# Patient Record
Sex: Male | Born: 1970 | Race: White | Hispanic: No | Marital: Single
Health system: Southern US, Community
[De-identification: ages and names within clinical notes are randomized; demographics above are authoritative.]

---

## 2006-06-24 ENCOUNTER — Emergency Department (HOSPITAL_COMMUNITY): Admission: EM | Admit: 2006-06-24 | Discharge: 2006-06-24 | Payer: Self-pay | Admitting: Emergency Medicine

## 2006-06-26 ENCOUNTER — Emergency Department (HOSPITAL_COMMUNITY): Admission: EM | Admit: 2006-06-26 | Discharge: 2006-06-26 | Payer: Self-pay | Admitting: *Deleted

## 2007-08-03 IMAGING — CT CT NECK W/ CM
3 of 4 series · 16 of 33 positions shown, 19 images · IV contrast (APPLIED)
Comparison: none

CLINICAL DATA: Right-sided jaw and neck swelling and pain.  Evaluate for abscess.  
 NECK CT WITH CONTRAST:
TECHNIQUE: Multidetector CT imaging of the neck was performed following the standard protocol during administration of intravenous contrast.
 Contrast:  100 cc Omnipaque 300.

[Series 4: thin section neck st 2.0 · axial · 0.48mm/px · z∈[+1170,+1354]mm · 8 of 331 slices shown, 10 images]
[im 34/331  soft-tissue]
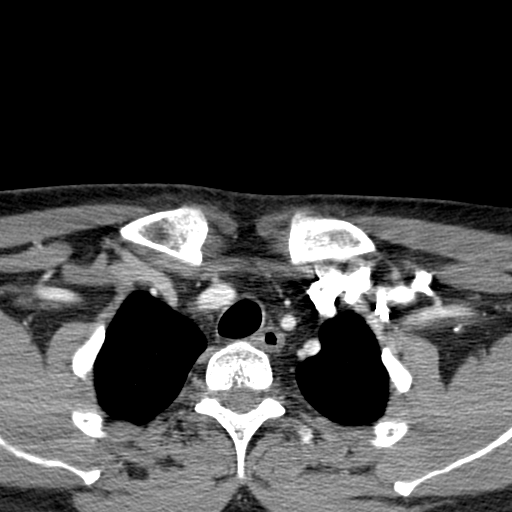
[im 34/331  bone]
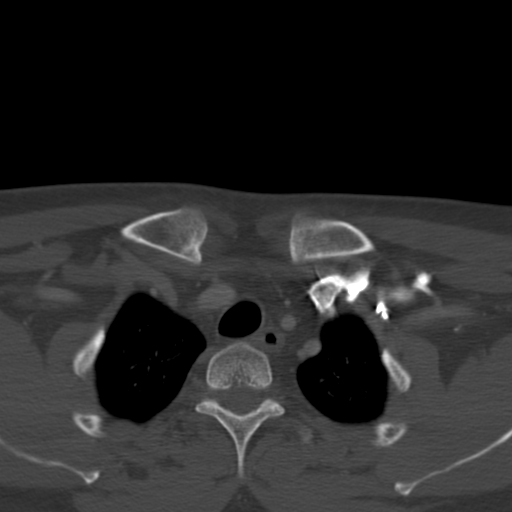
[im 67/331  bone]
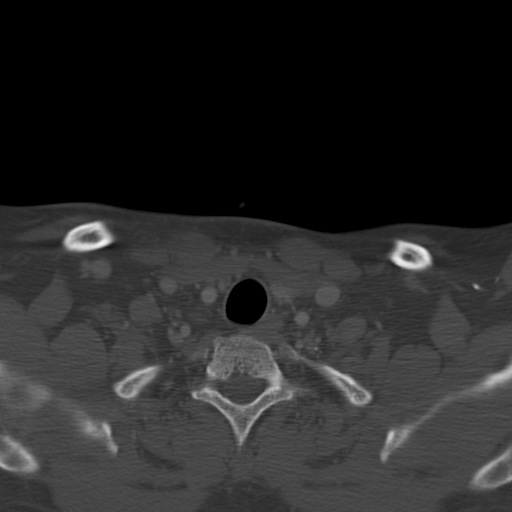
[im 100/331  bone]
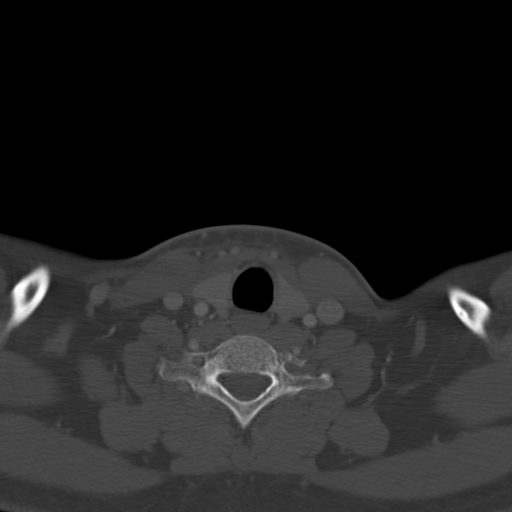
[im 133/331  bone]
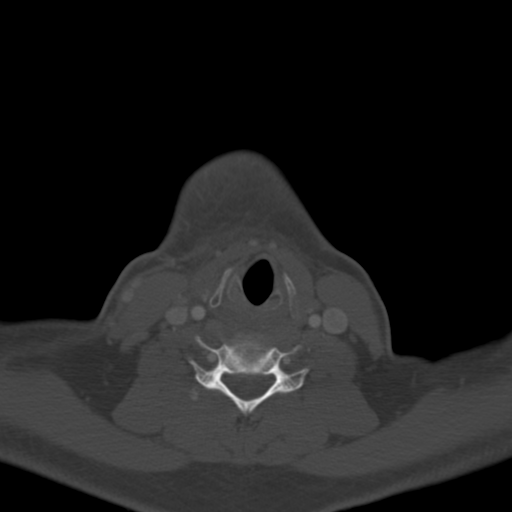
[im 199/331  soft-tissue]
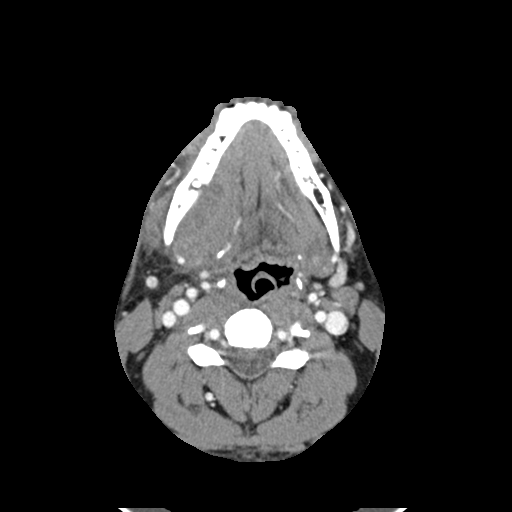
[im 199/331  bone]
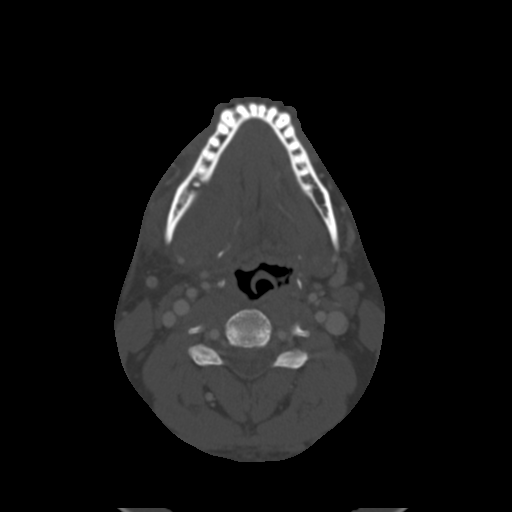
[im 232/331  bone]
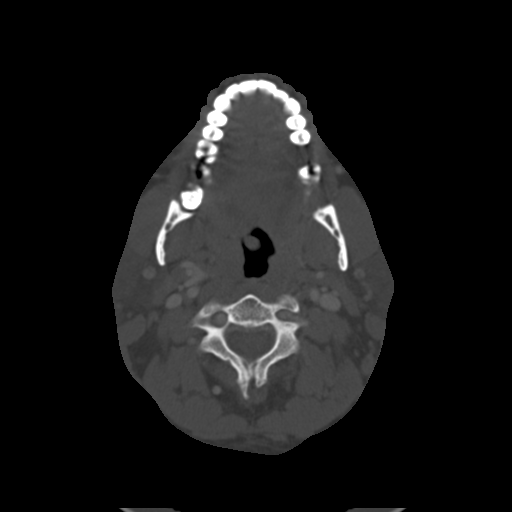
[im 265/331  bone]
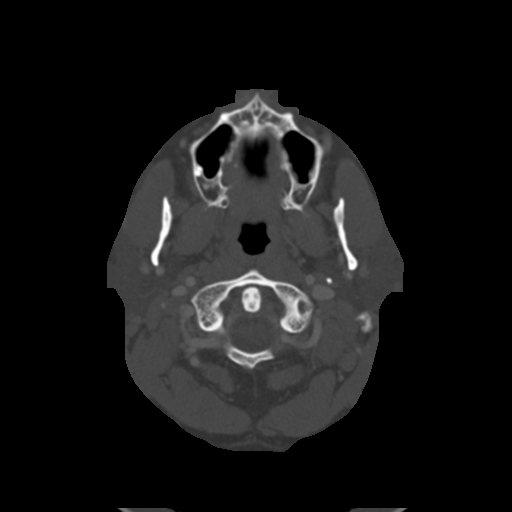
[im 298/331  bone]
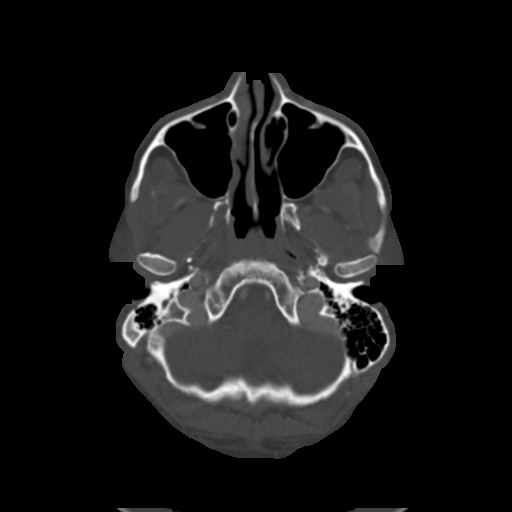

[Series 602: coronal · coronal · 0.48mm/px · 3 of 87 slices shown]
[im 18/87  bone]
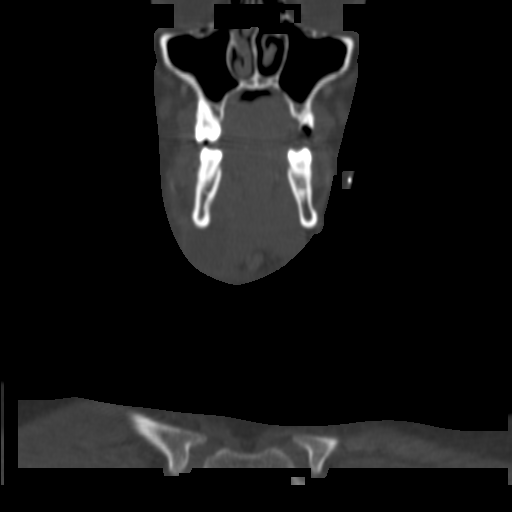
[im 35/87  bone]
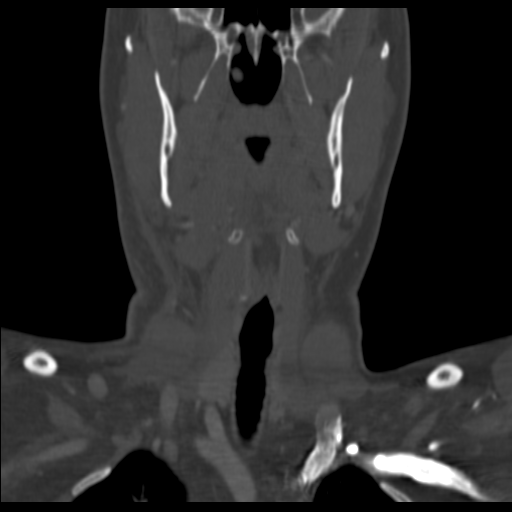
[im 52/87  bone]
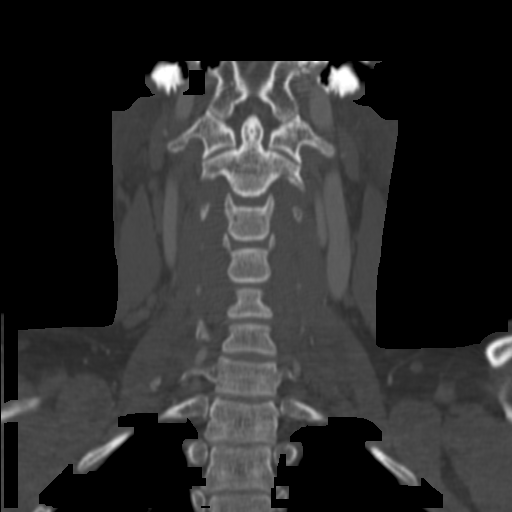

[Series 603: saggital · sagittal · 0.48mm/px · 5 of 82 slices shown, 6 images]
[im 28/82  bone]
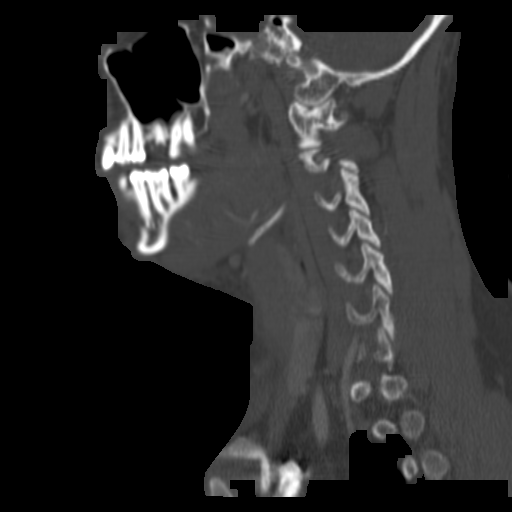
[im 34/82  bone]
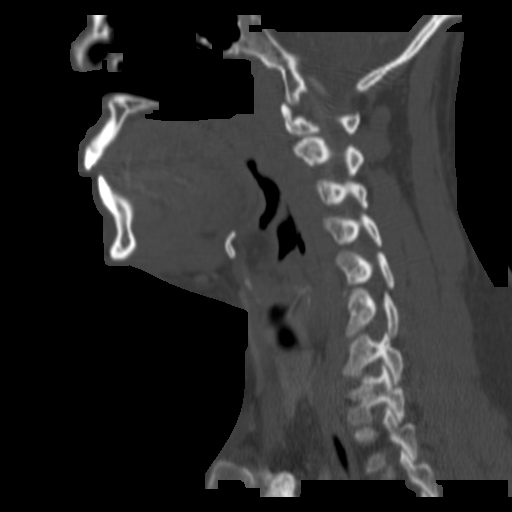
[im 41/82  soft-tissue]
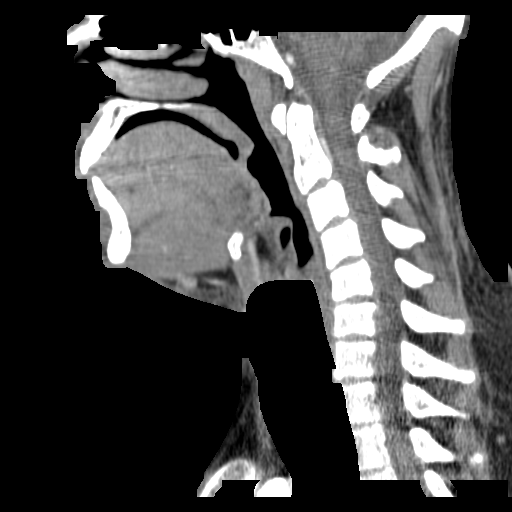
[im 41/82  bone]
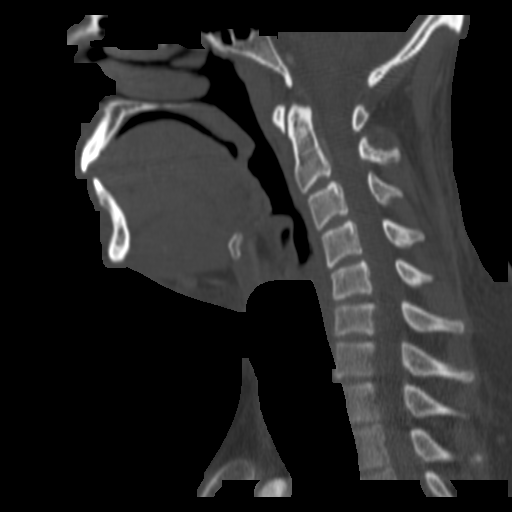
[im 48/82  bone]
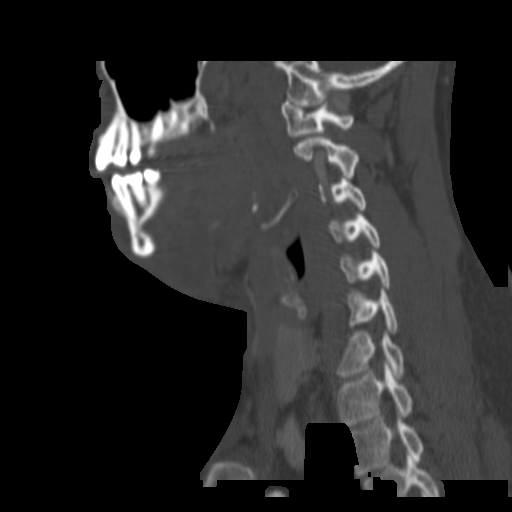
[im 55/82  bone]
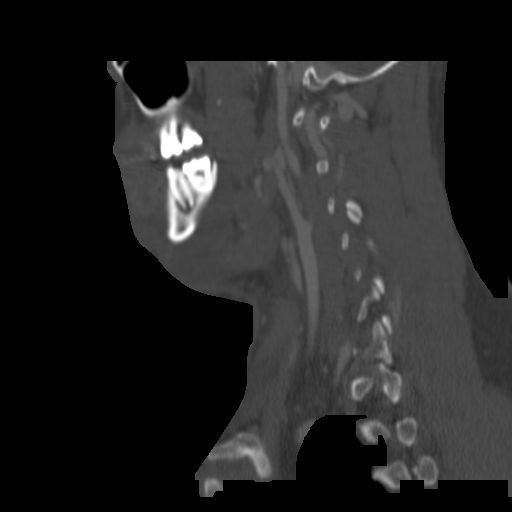

[16 of 33 positions shown; findings below may reference images not displayed]

FINDINGS: A cystic mass with mild rim enhancement and several internal septations is seen in the right submandibular region anterior to the right submandibular gland.  This measures approximately 3.6 X 2.4 cm and surrounds an area of abnormal apical lucency involving the right second mandibular molar.  This is consistent with a submandibular abscess.  
 There is mild inflammatory change in the subcutaneous fat in the submandibular region as well as small, less than 1 cm shotty submandibular lymph nodes.  No other masses or pathologically enlarged lymph nodes are seen within the neck.  The laryngeal structures and cervical airway are normal in appearance.
IMPRESSION: 3.6 cm right submandibular abscess which appears to arise from a large caries and periodontal disease involving the right second mandibular molar.

## 2020-02-21 ENCOUNTER — Ambulatory Visit: Payer: Self-pay | Attending: Internal Medicine

## 2020-02-21 DIAGNOSIS — Z23 Encounter for immunization: Secondary | ICD-10-CM

## 2020-02-21 NOTE — Progress Notes (Signed)
   Covid-19 Vaccination Clinic  Name:  RASHAD AULD    MRN: [id number] DOB: 05-25-1971  02/21/2020  Mr. Trefz was observed post Covid-19 immunization for 15 minutes without incident. He was provided with Vaccine Information Sheet and instruction to access the V-Safe system.   Mr. Hubbert was instructed to call 911 with any severe reactions post vaccine: Marland Kitchen Difficulty breathing  . Swelling of face and throat  . A fast heartbeat  . A bad rash all over body  . Dizziness and weakness   Immunizations Administered    Name Date Dose VIS Date Route   Pfizer COVID-19 Vaccine 02/21/2020  2:16 PM 0.3 mL 10/26/2019 Intramuscular   Manufacturer: [employer], [employer]   Lot: [id number]   NDC: [address]-2

## 2020-03-17 ENCOUNTER — Ambulatory Visit: Payer: Self-pay | Attending: Internal Medicine

## 2020-03-17 DIAGNOSIS — Z23 Encounter for immunization: Secondary | ICD-10-CM

## 2020-03-17 NOTE — Progress Notes (Signed)
   Covid-19 Vaccination Clinic  Name:  FINDLEY VI    MRN: [id number] DOB: Apr 01, 1971  03/17/2020  Mr. Garcilazo was observed post Covid-19 immunization for 15 minutes without incident. He was provided with Vaccine Information Sheet and instruction to access the V-Safe system.   Mr. Drumwright was instructed to call 911 with any severe reactions post vaccine: Marland Kitchen Difficulty breathing  . Swelling of face and throat  . A fast heartbeat  . A bad rash all over body  . Dizziness and weakness   Immunizations Administered    Name Date Dose VIS Date Route   Pfizer COVID-19 Vaccine 03/17/2020  4:35 PM 0.3 mL 01/09/2019 Intramuscular   Manufacturer: [employer], [employer]   Lot: [id number]   NDC: [address]-2

## 2020-10-18 ENCOUNTER — Ambulatory Visit: Payer: Self-pay | Attending: Internal Medicine

## 2020-10-18 DIAGNOSIS — Z23 Encounter for immunization: Secondary | ICD-10-CM

## 2020-10-18 NOTE — Progress Notes (Signed)
   Covid-19 Vaccination Clinic  Name:  Trevor Cooley    MRN: [id number] DOB: 10/30/1971  10/18/2020  [name] was observed post Covid-19 immunization for 15 minutes without incident. He was provided with Vaccine Information Sheet and instruction to access the V-Safe system.   [name] was instructed to call 911 with any severe reactions post vaccine: Marland Kitchen Difficulty breathing  . Swelling of face and throat  . A fast heartbeat  . A bad rash all over body  . Dizziness and weakness   Immunizations Administered    Name Date Dose VIS Date Route   Pfizer COVID-19 Vaccine 10/18/2020 10:36 AM 0.3 mL 09/03/2020 Intramuscular   Manufacturer: [employer], [employer]   Lot: [id number]   NDC: [address]-5
